# Patient Record
Sex: Female | Born: 1990 | Race: Black or African American | Hispanic: No | Marital: Single | Smoking: Never smoker
Health system: Southern US, Community
[De-identification: ages and names within clinical notes are randomized; demographics above are authoritative.]

---

## 2009-08-25 ENCOUNTER — Emergency Department (HOSPITAL_COMMUNITY): Admission: EM | Admit: 2009-08-25 | Discharge: 2009-08-25 | Payer: Self-pay | Admitting: Emergency Medicine

## 2010-10-18 LAB — DIFFERENTIAL
Basophils Absolute: 0 10*3/uL (ref 0.0–0.1)
Eosinophils Relative: 0 % (ref 0–5)
Lymphocytes Relative: 11 % — ABNORMAL LOW (ref 12–46)
Lymphs Abs: 1.6 10*3/uL (ref 0.7–4.0)
Monocytes Absolute: 0.7 10*3/uL (ref 0.1–1.0)

## 2010-10-18 LAB — COMPREHENSIVE METABOLIC PANEL
AST: 15 U/L (ref 0–37)
Albumin: 3.6 g/dL (ref 3.5–5.2)
Chloride: 107 mEq/L (ref 96–112)
Creatinine, Ser: 0.81 mg/dL (ref 0.4–1.2)
GFR calc Af Amer: >60 mL/min (ref >60)
Sodium: 136 mEq/L (ref 135–145)
Total Bilirubin: 0.1 mg/dL — ABNORMAL LOW (ref 0.3–1.2)

## 2010-10-18 LAB — CBC
MCV: 93.2 fL (ref 78.0–100.0)
Platelets: 259 10*3/uL (ref 150–400)
WBC: 14.8 10*3/uL — ABNORMAL HIGH (ref 4.0–10.5)

## 2010-10-18 LAB — URINALYSIS, ROUTINE W REFLEX MICROSCOPIC
Bilirubin Urine: NEGATIVE
Hgb urine dipstick: NEGATIVE
Specific Gravity, Urine: 1.026 (ref 1.005–1.030)
pH: 6 (ref 5.0–8.0)

## 2010-10-18 LAB — WET PREP, GENITAL
Trich, Wet Prep: NONE SEEN
Yeast Wet Prep HPF POC: NONE SEEN

## 2010-10-18 LAB — GC/CHLAMYDIA PROBE AMP, GENITAL: Chlamydia, DNA Probe: NEGATIVE

## 2019-04-18 ENCOUNTER — Ambulatory Visit: Payer: Self-pay | Admitting: Family Medicine

## 2019-04-30 ENCOUNTER — Ambulatory Visit: Payer: Self-pay | Admitting: Family Medicine

## 2019-09-09 ENCOUNTER — Emergency Department (HOSPITAL_COMMUNITY)
Admission: EM | Admit: 2019-09-09 | Discharge: 2019-09-09 | Disposition: A | Discharge status: Home / Self Care | Payer: Self-pay | Attending: Emergency Medicine | Admitting: Emergency Medicine

## 2019-09-09 ENCOUNTER — Encounter (HOSPITAL_COMMUNITY): Payer: Self-pay | Admitting: Emergency Medicine

## 2019-09-09 ENCOUNTER — Other Ambulatory Visit: Payer: Self-pay

## 2019-09-09 ENCOUNTER — Emergency Department (HOSPITAL_COMMUNITY): Payer: Self-pay

## 2019-09-09 DIAGNOSIS — Z79899 Other long term (current) drug therapy: Secondary | ICD-10-CM | POA: Insufficient documentation

## 2019-09-09 DIAGNOSIS — R079 Chest pain, unspecified: Secondary | ICD-10-CM

## 2019-09-09 DIAGNOSIS — N760 Acute vaginitis: Secondary | ICD-10-CM | POA: Insufficient documentation

## 2019-09-09 DIAGNOSIS — B9689 Other specified bacterial agents as the cause of diseases classified elsewhere: Secondary | ICD-10-CM

## 2019-09-09 LAB — CBC
HCT: 44.2 % (ref 36.0–46.0)
Hemoglobin: 14.5 g/dL (ref 12.0–15.0)
MCH: 30.7 pg (ref 26.0–34.0)
MCHC: 32.8 g/dL (ref 30.0–36.0)
MCV: 93.4 fL (ref 80.0–100.0)
Platelets: 295 10*3/uL (ref 150–400)
RBC: 4.73 MIL/uL (ref 3.87–5.11)
RDW: 13.7 % (ref 11.5–15.5)
WBC: 10.6 10*3/uL — ABNORMAL HIGH (ref 4.0–10.5)
nRBC: 0 % (ref 0.0–0.2)

## 2019-09-09 LAB — BASIC METABOLIC PANEL
Anion gap: 9 (ref 5–15)
BUN: 11 mg/dL (ref 6–20)
CO2: 22 mmol/L (ref 22–32)
Calcium: 8.9 mg/dL (ref 8.9–10.3)
Chloride: 106 mmol/L (ref 98–111)
Creatinine, Ser: 0.94 mg/dL (ref 0.44–1.00)
GFR calc Af Amer: >60 mL/min (ref >60)
GFR calc non Af Amer: >60 mL/min (ref >60)
Glucose, Bld: 115 mg/dL — ABNORMAL HIGH (ref 70–99)
Potassium: 3.6 mmol/L (ref 3.5–5.1)
Sodium: 137 mmol/L (ref 135–145)

## 2019-09-09 LAB — WET PREP, GENITAL
Sperm: NONE SEEN
Trich, Wet Prep: NONE SEEN
Yeast Wet Prep HPF POC: NONE SEEN

## 2019-09-09 LAB — TROPONIN I (HIGH SENSITIVITY): Troponin I (High Sensitivity): 3 ng/L (ref <18)

## 2019-09-09 LAB — I-STAT BETA HCG BLOOD, ED (MC, WL, AP ONLY): I-stat hCG, quantitative: <5 m[IU]/mL (ref <5)

## 2019-09-09 MED ORDER — LORAZEPAM 0.5 MG PO TABS
0.5000 mg | ORAL_TABLET | Freq: Once | ORAL | Status: AC
  Administered 2019-09-09: 03:00:00 0.5 mg via ORAL
  Filled 2019-09-09: qty 1

## 2019-09-09 MED ORDER — METRONIDAZOLE 500 MG PO TABS: 500.0000 mg | ORAL_TABLET | Freq: Two times a day (BID) | ORAL | 0 refills | Status: DC

## 2019-09-09 MED ORDER — ONDANSETRON 4 MG PO TBDP: 4.0000 mg | ORAL_TABLET | Freq: Once | ORAL | Status: DC

## 2019-09-09 MED ORDER — SODIUM CHLORIDE 0.9% FLUSH: 3.0000 mL | Freq: Once | INTRAVENOUS | Status: DC

## 2019-09-09 NOTE — ED Notes (Signed)
Pelvic setup at bedside.

## 2019-09-09 NOTE — ED Provider Notes (Signed)
[facility]-EMERGENCY DEPT Provider Note   CSN: [id number] Arrival date & time: 09/09/19  0033     History Chief Complaint  Patient presents with  . Chest Pain    Connie Walters is a 29 y.o. female.  Patient to ED with complaint of intermittent chest pain that starts in the sternal chest and radiates to left chest that started 2 days ago. She states she has palpitations that sometimes feel like racing heart rate and sometimes like her heart skips a beat.  She cannot identify any pattern or modifying factors. Episodes where she has pain vary from a few minutes to a few hours. No SOB or DOE but she states the pain sometimes makes her want to take a deep breath. No cough, fever, congestion, nausea, abdominal pain. No urinary symptoms. She reports having onset of a pinkish vaginal discharge yesterday. No pelvic pain. She reports having been sexually active almost 2 weeks ago but endorses use of barrier protection.    Chest Pain Associated symptoms: palpitations   Associated symptoms: no abdominal pain, no fever, no headache, no nausea, no numbness, no shortness of breath and no weakness        History reviewed. No pertinent past medical history.  There are no problems to display for this patient.   History reviewed. No pertinent surgical history.   OB History   No obstetric history on file.     No family history on file.  Social History   Tobacco Use  . Smoking status: Never Smoker  . Smokeless tobacco: Never Used  Substance Use Topics  . Alcohol use: Never  . Drug use: Yes    Types: Marijuana    Home Medications Prior to Admission medications   Medication Sig Start Date End Date Taking? Authorizing Provider  diphenhydrAMINE (BENADRYL) 25 MG tablet Take 50 mg by mouth every 6 (six) hours as needed for allergies.   Yes [provider]  ibuprofen (ADVIL) 200 MG tablet Take 600 mg by mouth every 6 (six) hours as needed for moderate pain.    Yes [provider]  triamcinolone cream (KENALOG) 0.1 % Apply 1 application topically 2 (two) times daily as needed (eczema).   Yes [provider]    Allergies    Patient has no known allergies.  Review of Systems   Review of Systems  Constitutional: Negative for chills and fever.  HENT: Negative.   Respiratory: Negative.  Negative for shortness of breath.   Cardiovascular: Positive for chest pain and palpitations. Negative for leg swelling.  Gastrointestinal: Negative.  Negative for abdominal pain and nausea.  Genitourinary: Positive for vaginal discharge. Negative for dysuria and pelvic pain.  Musculoskeletal: Negative.   Skin: Negative.  Negative for color change and wound.  Neurological: Negative.  Negative for weakness, numbness and headaches.    Physical Exam Updated Vital Signs BP 115/70   Pulse 76   Temp 98.8 F (37.1 C) (Oral)   Resp 15   Ht 5\' 5"  (1.651 m)   Wt 108.9 kg   SpO2 99%   BMI 39.94 kg/m   Physical Exam Vitals and nursing note reviewed.  Constitutional:      General: She is not in acute distress.    Appearance: She is well-developed.  HENT:     Head: Normocephalic and atraumatic.  Cardiovascular:     Rate and Rhythm: Normal rate and regular rhythm.     Heart sounds: No murmur.  Pulmonary:     Effort:  Pulmonary effort is normal.     Breath sounds: Normal breath sounds. No wheezing, rhonchi or rales.  Chest:     Chest wall: Tenderness (Left chest tenderness to palpation.) present.  Abdominal:     General: Bowel sounds are normal.     Palpations: Abdomen is soft.     Tenderness: There is no abdominal tenderness. There is no guarding or rebound.  Genitourinary:    Comments: There is a cervical discharge that is thin, yellow in appearance with dark streak, ?old blood. No cervical lesions, CMT, adnexal mass or tenderness.  Musculoskeletal:        General: Normal range of motion.     Cervical back: Normal range of motion and  neck supple.     Comments: No midline cervical tenderness. There is right upper parathoracic tenderness without swelling. FROM UE's with preserved strength. No bony deformities.   Skin:    General: Skin is warm and dry.     Findings: No rash.  Neurological:     Mental Status: She is alert and oriented to person, place, and time.     ED Results / Procedures / Treatments   Labs (all labs ordered are listed, but only abnormal results are displayed) Labs Reviewed  BASIC METABOLIC PANEL - Abnormal; Notable for the following components:      Result Value   Glucose, Bld 115 (*)    All other components within normal limits  CBC - Abnormal; Notable for the following components:   WBC 10.6 (*)    All other components within normal limits  WET PREP, GENITAL  I-STAT BETA HCG BLOOD, ED ([facility], [facility], [facility] ONLY)  GC/CHLAMYDIA PROBE AMP ([place]) NOT AT [facility]  TROPONIN I (HIGH SENSITIVITY)   Results for orders placed or performed during the hospital encounter of 09/09/19  Wet prep, genital  Result Value Ref Range   Yeast Wet Prep HPF POC NONE SEEN NONE SEEN   Trich, Wet Prep NONE SEEN NONE SEEN   Clue Cells Wet Prep HPF POC PRESENT (A) NONE SEEN   WBC, Wet Prep HPF POC PRESENT (A) NONE SEEN   Sperm NONE SEEN   Basic metabolic panel  Result Value Ref Range   Sodium 137 135 - 145 mmol/L   Potassium 3.6 3.5 - 5.1 mmol/L   Chloride 106 98 - 111 mmol/L   CO2 22 22 - 32 mmol/L   Glucose, Bld 115 (H) 70 - 99 mg/dL   BUN 11 6 - 20 mg/dL   Creatinine, Ser 2.76 0.44 - 1.00 mg/dL   Calcium 8.9 8.9 - 14.7 mg/dL   GFR calc non Af Amer >60 >60 mL/min   GFR calc Af Amer >60 >60 mL/min   Anion gap 9 5 - 15  CBC  Result Value Ref Range   WBC 10.6 (H) 4.0 - 10.5 K/uL   RBC 4.73 3.87 - 5.11 MIL/uL   Hemoglobin 14.5 12.0 - 15.0 g/dL   HCT 09.2 95.7 - 47.3 %   MCV 93.4 80.0 - 100.0 fL   MCH 30.7 26.0 - 34.0 pg   MCHC 32.8 30.0 - 36.0 g/dL   RDW 40.3 70.9 - 64.3 %   Platelets 295 150 - 400 K/uL    nRBC 0.0 0.0 - 0.2 %  I-Stat beta hCG blood, ED  Result Value Ref Range   I-stat hCG, quantitative <5.0 <5 mIU/mL   Comment 3          Troponin I (High Sensitivity)  Result  Value Ref Range   Troponin I (High Sensitivity) 3 <18 ng/L     EKG EKG Interpretation  Date/Time:  Monday September 09 2019 00:48:59 EST Ventricular Rate:  86 PR Interval:    QRS Duration: 89 QT Interval:  350 QTC Calculation: 419 R Axis:   42 Text Interpretation: Sinus rhythm Nonspecific T wave abnormality NO STEMI. No old tracing to compare Confirmed by Addison Lank [phone]) on 09/09/2019 12:52:15 AM   Radiology DG Chest 2 View  Result Date: 09/09/2019 CLINICAL DATA:  Chest pain EXAM: CHEST - 2 VIEW COMPARISON:  None. FINDINGS: The heart size and mediastinal contours are within normal limits. Both lungs are clear. The visualized skeletal structures are unremarkable. IMPRESSION: No active cardiopulmonary disease. Electronically Signed   By: Prudencio Pair M.D.   On: 09/09/2019 01:24    Procedures Procedures (including critical care time)  Medications Ordered in ED Medications  sodium chloride flush (NS) 0.9 % injection 3 mL (has no administration in time range)  LORazepam (ATIVAN) tablet 0.5 mg (has no administration in time range)    ED Course  I have reviewed the triage vital signs and the nursing notes.  Pertinent labs & imaging results that were available during my care of the patient were reviewed by me and considered in my medical decision making (see chart for details).    MDM Rules/Calculators/A&P                     Patient to ED with symptoms as detailed in HPI.   Patient describes intermittent chest discomfort, palpitations x 2-3 days. Has had similar symptoms in the past but not that lasted this long. No ss/sxs infection. Doubt ACS as sxs atypical, neg troponin, EKG NSR, normal CXR. Ativan provided to see if this would help symptoms resolve. Possible related to anxiety.  She has a vaginal  d/ch without pelvic tenderness. Low degree of suspicion for GC/chlamydia. BV present. The patient reports having an abnormal PAP smear 2 years ago but did not follow up for further evaluation. Strongly suggested she make an appointment with GYN. Patient reports she was referred by her PCP but prefers an office that employs black staff and has physicians of color, which the office she was referred to did not. Will refer to Dr. Delsa Sale as a possibility for her. Otherwise, return to her PCP for further referral.   Flagyl provided for treatment of BV. She is referred back to her PCP for further evaluation and treatment of nonspecific chest symptoms.    Final Clinical Impression(s) / ED Diagnoses Final diagnoses:  None   1. Nonspecific chest pain 2. BV  Rx / DC Orders ED Discharge Orders    None       Charlann Lange, PA-C 09/09/19 [place], MD 09/09/19 [phone]

## 2019-09-09 NOTE — ED Triage Notes (Addendum)
Patient here from home with complaints of left sided chest pain that started 2 days ago. States "I can feel my heart racing". Denies n/v.

## 2019-09-09 NOTE — Discharge Instructions (Addendum)
See your primary care provider for further evaluation of nonspecific chest discomfort and palpitations.   Please follow up with gynecology, either with Dr. Tamela Oddi, or with a referral provided by your doctor.   Return to the ED for any emergency medical needs.

## 2019-09-10 LAB — GC/CHLAMYDIA PROBE AMP (~~LOC~~) NOT AT ARMC
Chlamydia: NEGATIVE
Neisseria Gonorrhea: NEGATIVE

## 2021-02-08 IMAGING — CR DG CHEST 2V
2 series · 2 of 2 positions shown · non-contrast
Comparison: None.

CLINICAL DATA: Chest pain

EXAM:
CHEST - 2 VIEW

[w chest pa]
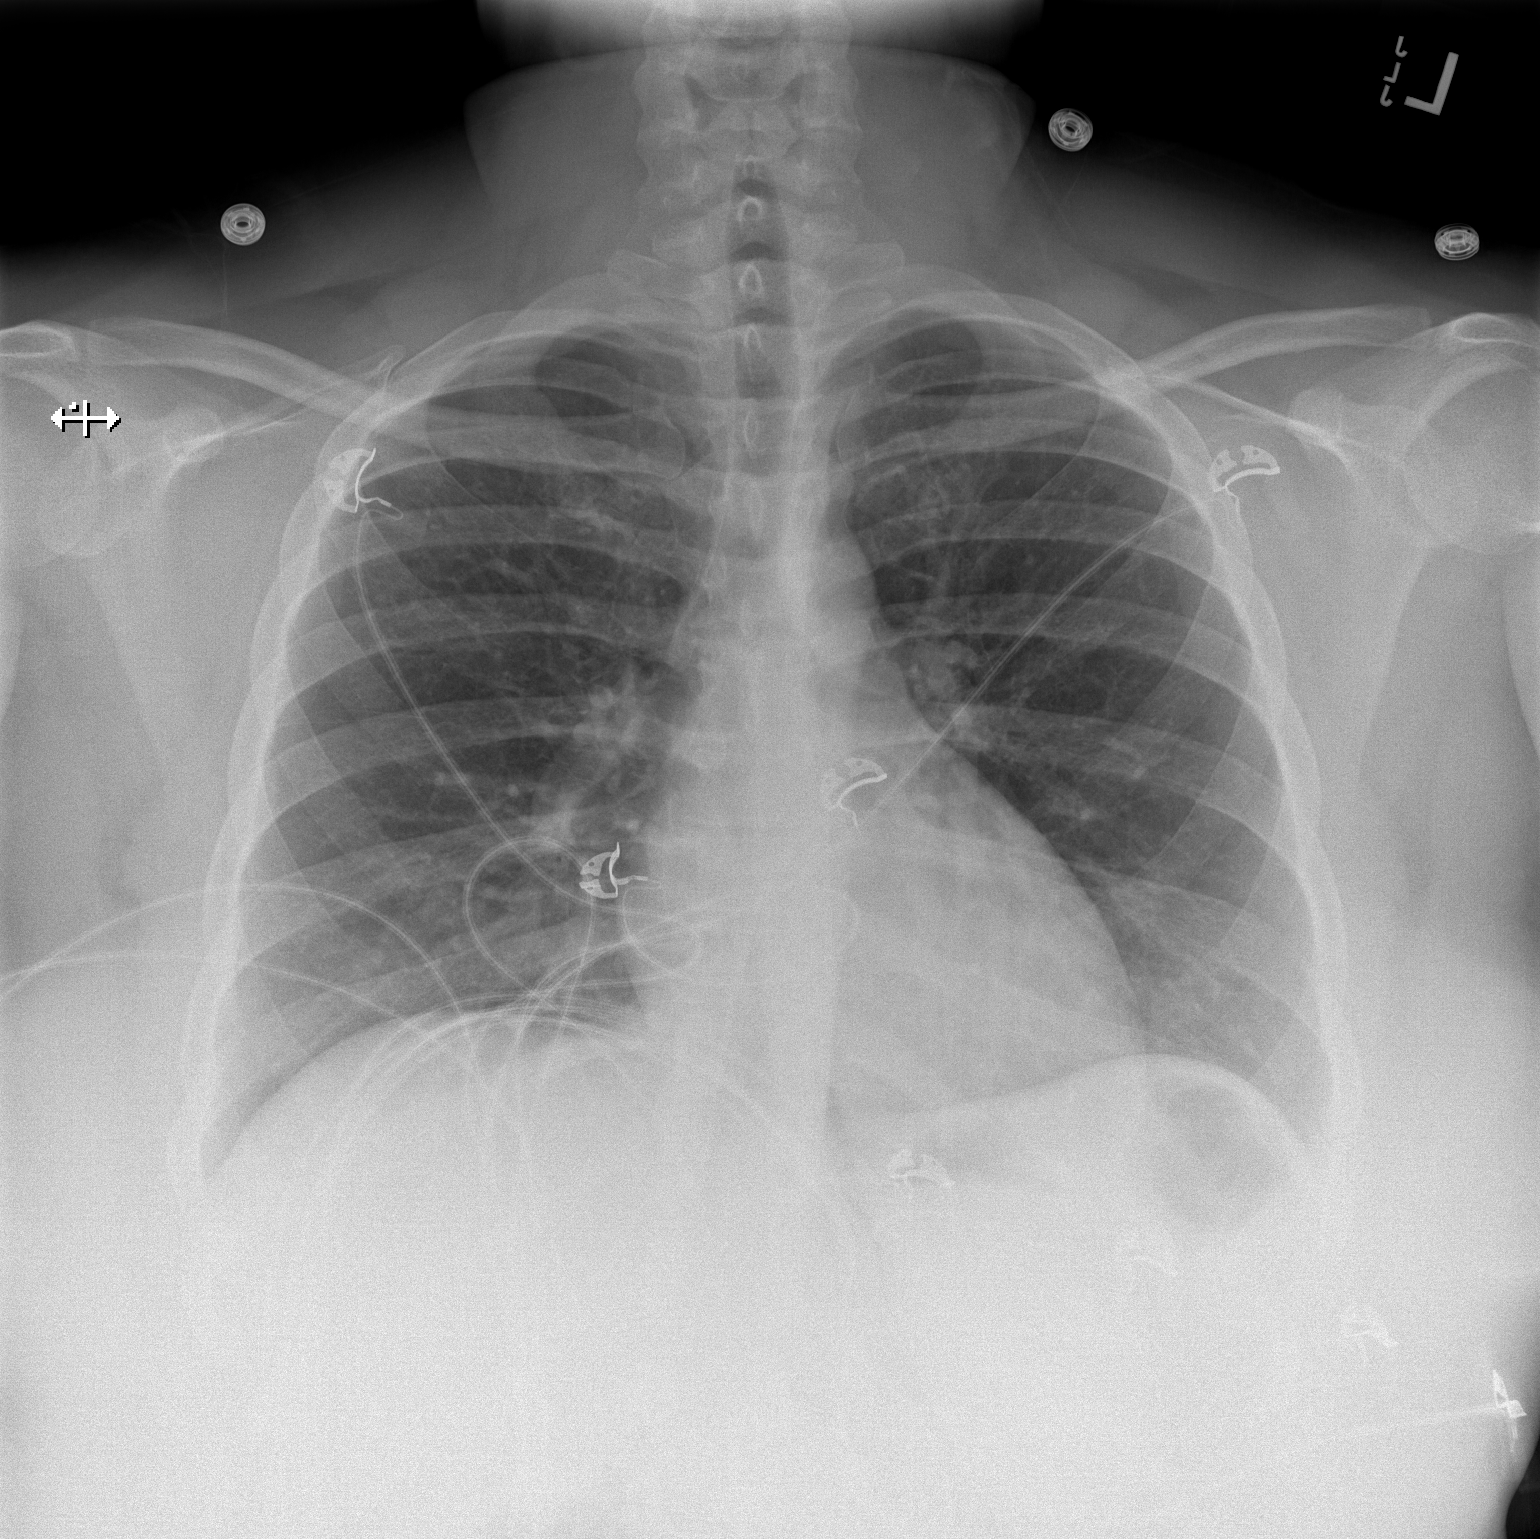

[w chest lat]
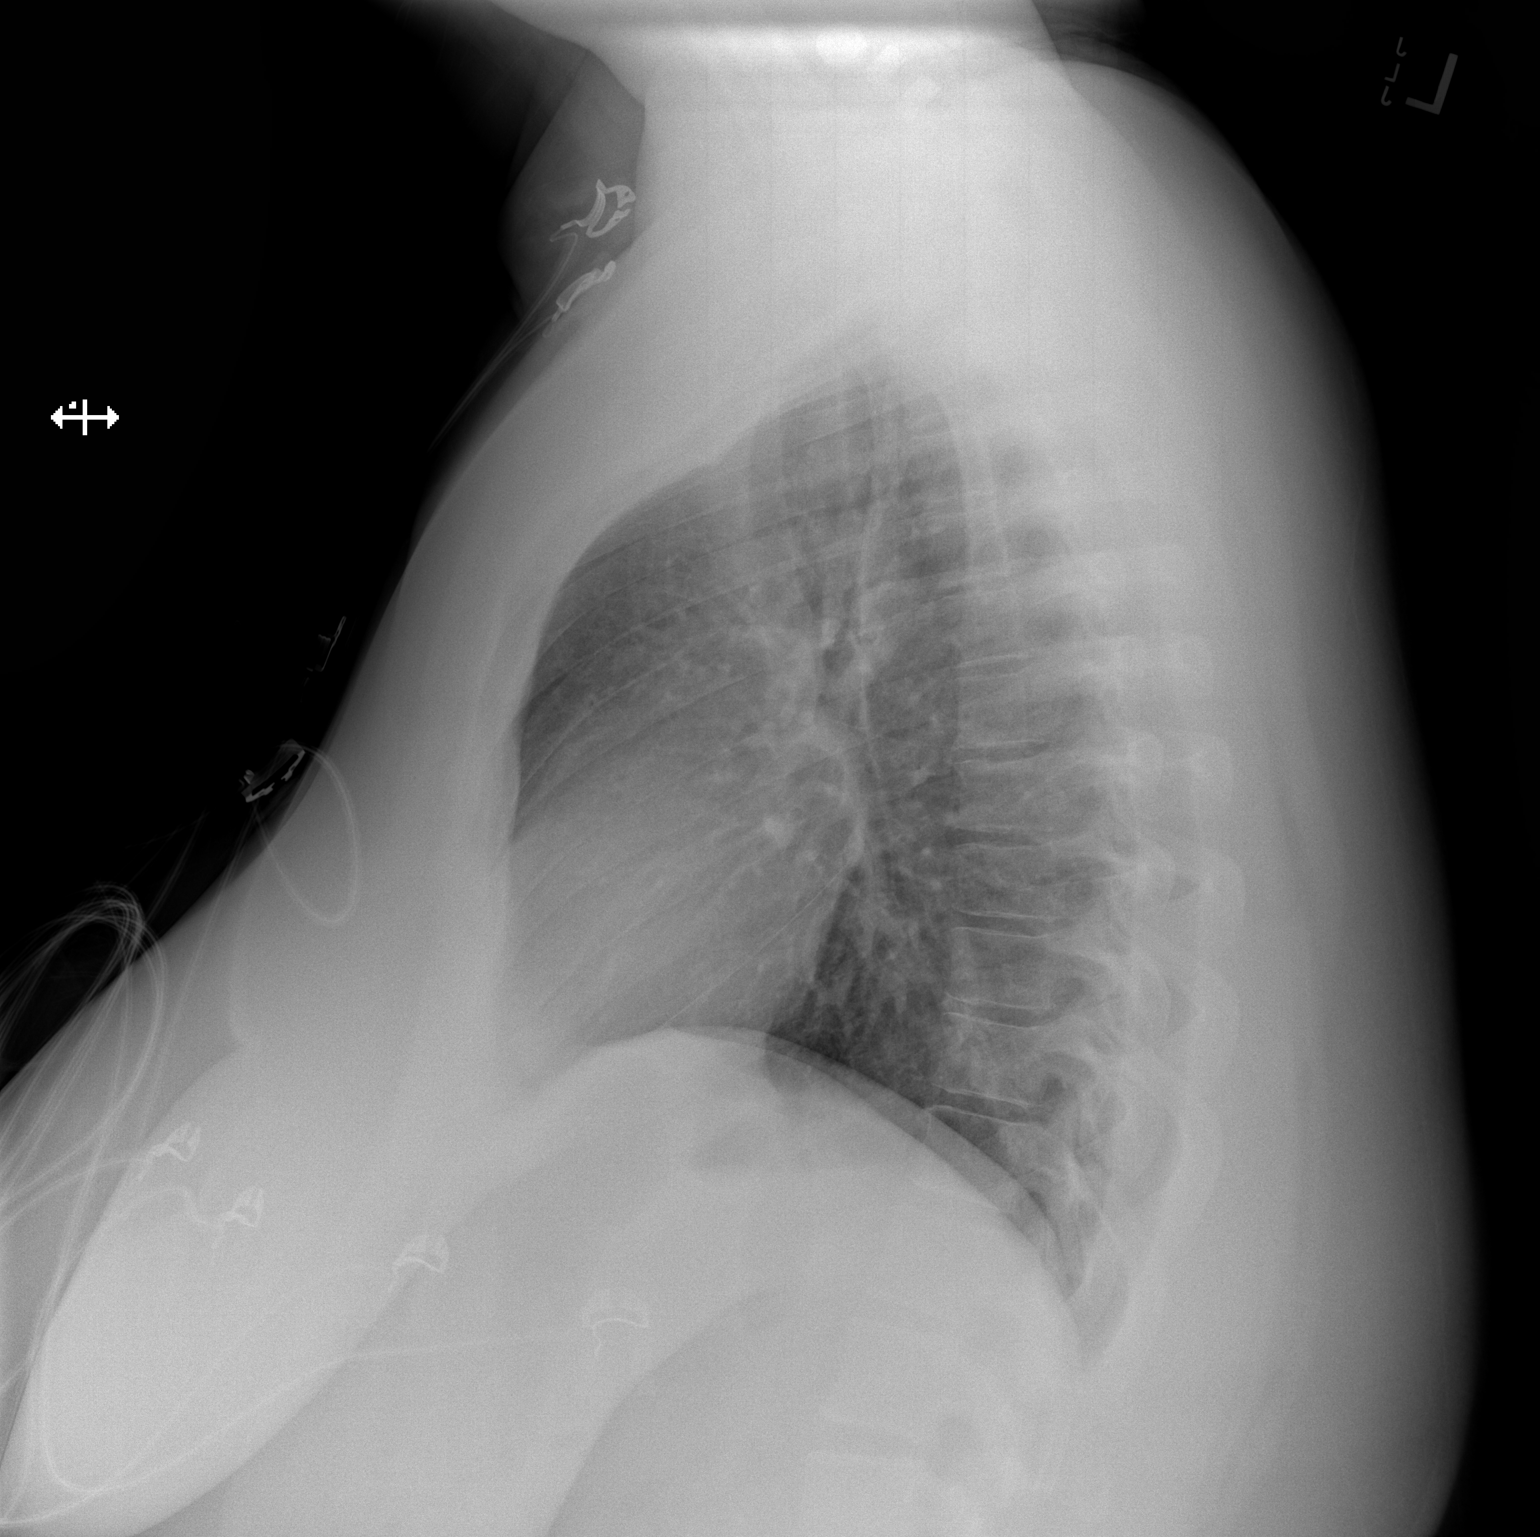

[2 of 2 positions shown; findings below may reference images not displayed]

FINDINGS: The heart size and mediastinal contours are within normal limits.
Both lungs are clear. The visualized skeletal structures are
unremarkable.
IMPRESSION: No active cardiopulmonary disease.

## 2021-04-16 ENCOUNTER — Ambulatory Visit
Admission: EM | Admit: 2021-04-16 | Discharge: 2021-04-16 | Disposition: A | Discharge status: Home / Self Care | Payer: BC Managed Care – PPO | Attending: Emergency Medicine | Admitting: Emergency Medicine

## 2021-04-16 ENCOUNTER — Other Ambulatory Visit: Payer: Self-pay

## 2021-04-16 DIAGNOSIS — Z760 Encounter for issue of repeat prescription: Secondary | ICD-10-CM | POA: Diagnosis not present

## 2021-04-16 DIAGNOSIS — L309 Dermatitis, unspecified: Secondary | ICD-10-CM | POA: Diagnosis not present

## 2021-04-16 DIAGNOSIS — J4520 Mild intermittent asthma, uncomplicated: Secondary | ICD-10-CM | POA: Insufficient documentation

## 2021-04-16 DIAGNOSIS — N939 Abnormal uterine and vaginal bleeding, unspecified: Secondary | ICD-10-CM

## 2021-04-16 MED ORDER — ALBUTEROL SULFATE HFA 108 (90 BASE) MCG/ACT IN AERS: 1.0000 | INHALATION_SPRAY | Freq: Four times a day (QID) | RESPIRATORY_TRACT | 0 refills | Status: AC | PRN

## 2021-04-16 MED ORDER — TRIAMCINOLONE ACETONIDE 0.1 % EX CREA: 1.0000 "application " | TOPICAL_CREAM | Freq: Two times a day (BID) | CUTANEOUS | 0 refills | Status: AC

## 2021-04-16 NOTE — ED Triage Notes (Signed)
Pregnancy test d/c'd by provider

## 2021-04-16 NOTE — ED Provider Notes (Signed)
[facility]    CSN: [id number] Arrival date & time: 04/16/21  1046      History   Chief Complaint Chief Complaint  Patient presents with   Eczema    HPI Connie Walters is a 30 y.o. female presenting today for medication refill and concern about irregular vaginal bleeding.  Requesting refill of albuterol inhaler-typically has asthma flares of wheezing around season changes.  Typically controlled with albuterol alone and denies needing any steroids.  Also requesting refill of eczema triamcinolone cream.  Flares typically occur on hands and face, has been using hydrocortisone without full relief of symptoms.  Uses CeraVe and Aveeno for moisturization.  Patient also reports history of PCOS and has had pink tinge discharge/vaginal spotting x2 years.  She denies any abdominal pain or pelvic pain.  Denies urinary symptoms.  Denies itching or irritation.  Has not had intercourse over the past 2 years as she reports previously would have increased bleeding after intercourse.  She has had prior abnormal Pap smears and colposcopy performed, but has not had a Pap smear updated in 3 years.  HPI  No past medical history on file.  There are no problems to display for this patient.   No past surgical history on file.  OB History   No obstetric history on file.      Home Medications    Prior to Admission medications   Medication Sig Start Date End Date Taking? Authorizing Provider  albuterol (VENTOLIN HFA) 108 (90 Base) MCG/ACT inhaler Inhale 1-2 puffs into the lungs every 6 (six) hours as needed for wheezing or shortness of breath. 04/16/21  Yes Wadie Liew C, PA-C  triamcinolone cream (KENALOG) 0.1 % Apply 1 application topically 2 (two) times daily. 04/16/21  Yes Annaelle Kasel C, PA-C  diphenhydrAMINE (BENADRYL) 25 MG tablet Take 50 mg by mouth every 6 (six) hours as needed for allergies.    [provider]  ibuprofen (ADVIL) 200 MG tablet Take 600 mg by mouth  every 6 (six) hours as needed for moderate pain.    [provider]  metroNIDAZOLE (FLAGYL) 500 MG tablet Take 1 tablet (500 mg total) by mouth 2 (two) times daily. 09/09/19   Elpidio Anis, PA-C    Family History No family history on file.  Social History Social History   Tobacco Use   Smoking status: Never   Smokeless tobacco: Never  Vaping Use   Vaping Use: Never used  Substance Use Topics   Alcohol use: Yes   Drug use: Not Currently    Types: Marijuana     Allergies   Dairycare [lactase-lactobacillus] and Food   Review of Systems Review of Systems  Constitutional:  Negative for fatigue and fever.  HENT:  Negative for mouth sores.   Eyes:  Negative for visual disturbance.  Respiratory:  Positive for shortness of breath and wheezing.   Cardiovascular:  Negative for chest pain.  Gastrointestinal:  Negative for abdominal pain, diarrhea, nausea and vomiting.  Genitourinary:  Positive for vaginal discharge. Negative for dysuria, flank pain, genital sores, hematuria, menstrual problem, vaginal bleeding and vaginal pain.  Musculoskeletal:  Negative for arthralgias, back pain and joint swelling.  Skin:  Positive for rash. Negative for color change and wound.  Neurological:  Negative for dizziness, weakness, light-headedness and headaches.    Physical Exam Triage Vital Signs ED Triage Vitals  Enc Vitals Group     BP 04/16/21 1153 122/84     Pulse Rate 04/16/21 1153 77  Resp 04/16/21 1153 20     Temp 04/16/21 1153 98.2 F (36.8 C)     Temp Source 04/16/21 1153 Oral     SpO2 04/16/21 1153 97 %     Weight --      Height --      Head Circumference --      Peak Flow --      Pain Score 04/16/21 1150 0     Pain Loc --      Pain Edu? --      Excl. in GC? --    No data found.  Updated Vital Signs BP 122/84 (BP Location: Left Arm)   Pulse 77   Temp 98.2 F (36.8 C) (Oral)   Resp 20   LMP 04/14/2021 (Approximate)   SpO2 97%   Visual Acuity Right Eye  Distance:   Left Eye Distance:   Bilateral Distance:    Right Eye Near:   Left Eye Near:    Bilateral Near:     Physical Exam Vitals and nursing note reviewed.  Constitutional:      Appearance: She is well-developed.     Comments: No acute distress  HENT:     Head: Normocephalic and atraumatic.     Nose: Nose normal.  Eyes:     Conjunctiva/sclera: Conjunctivae normal.  Cardiovascular:     Rate and Rhythm: Normal rate.  Pulmonary:     Effort: Pulmonary effort is normal. No respiratory distress.  Abdominal:     General: There is no distension.  Genitourinary:    Comments: Cervix erythematous, inflamed and friable Musculoskeletal:        General: Normal range of motion.     Cervical back: Neck supple.  Skin:    General: Skin is warm and dry.  Neurological:     Mental Status: She is alert and oriented to person, place, and time.     UC Treatments / Results  Labs (all labs ordered are listed, but only abnormal results are displayed) Labs Reviewed  HIV ANTIBODY (ROUTINE TESTING W REFLEX)  RPR  POCT URINE PREGNANCY  CERVICOVAGINAL ANCILLARY ONLY    EKG   Radiology No results found.  Procedures Procedures (including critical care time)  Medications Ordered in UC Medications - No data to display  Initial Impression / Assessment and Plan / UC Course  I have reviewed the triage vital signs and the nursing notes.  Pertinent labs & imaging results that were available during my care of the patient were reviewed by me and considered in my medical decision making (see chart for details).     Asthma-albuterol refilled, deferring steroids at this time, well controlled with albuterol alone Eczema-triamcinolone refilled, discussed use of this and other recommendations for eczema Irregular bleeding/pink-tinged discharge-recommended to follow-up with OB/GYN for updating of Pap smear given appearance of cervix and prior atypical Pap smears.  STD screening today including  HIV and syphilis and vaginal swab.  We will call with results and provide further treatment if needed.  Discussed strict return precautions. Patient verbalized understanding and is agreeable with plan.  Final Clinical Impressions(s) / UC Diagnoses   Final diagnoses:  Encounter for medication refill  Eczema, unspecified type  Mild intermittent asthma without complication  Vaginal spotting     Discharge Instructions      Please follow-up with OB/GYN for further evaluation of irregular bleeding/spotting, fertility concerns Albuterol inhaler 1 to 2 puffs as needed for shortness of breath chest tightness or wheezing Eczema-triamcinolone twice daily, use  thin amount and sparingly on face/around eyes  Please follow-up for any concerns     ED Prescriptions     Medication Sig Dispense Auth. Provider   albuterol (VENTOLIN HFA) 108 (90 Base) MCG/ACT inhaler Inhale 1-2 puffs into the lungs every 6 (six) hours as needed for wheezing or shortness of breath. 1 each Ramond Darnell C, PA-C   triamcinolone cream (KENALOG) 0.1 % Apply 1 application topically 2 (two) times daily. 453.6 g Lew Dawes, PA-C      PDMP not reviewed this encounter.   Lew Dawes, [place] 04/16/21 1244

## 2021-04-16 NOTE — Discharge Instructions (Signed)
Please follow-up with OB/GYN for further evaluation of irregular bleeding/spotting, fertility concerns Albuterol inhaler 1 to 2 puffs as needed for shortness of breath chest tightness or wheezing Eczema-triamcinolone twice daily, use thin amount and sparingly on face/around eyes  Please follow-up for any concerns

## 2021-04-16 NOTE — ED Triage Notes (Signed)
Pt states her eczema has been flaring, and requesting prescriptions to be refilled (Albuterol inhaler and eczema medication).  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

## 2021-04-17 LAB — HIV ANTIBODY (ROUTINE TESTING W REFLEX): HIV Screen 4th Generation wRfx: NONREACTIVE

## 2021-04-17 LAB — RPR: RPR Ser Ql: NONREACTIVE

## 2021-04-19 LAB — CERVICOVAGINAL ANCILLARY ONLY
Bacterial Vaginitis (gardnerella): NEGATIVE
Candida Glabrata: NEGATIVE
Candida Vaginitis: NEGATIVE
Chlamydia: NEGATIVE
Comment: NEGATIVE
Comment: NEGATIVE
Comment: NEGATIVE
Comment: NEGATIVE
Comment: NEGATIVE
Comment: NORMAL
Neisseria Gonorrhea: NEGATIVE
Trichomonas: POSITIVE — AB

## 2021-04-20 ENCOUNTER — Telehealth (HOSPITAL_COMMUNITY): Payer: Self-pay | Admitting: Emergency Medicine

## 2021-04-20 MED ORDER — METRONIDAZOLE 500 MG PO TABS: 500.0000 mg | ORAL_TABLET | Freq: Two times a day (BID) | ORAL | 0 refills | Status: AC
